# Patient Record
Sex: Male | Born: 1995 | Race: White | Hispanic: No | Marital: Single | Smoking: Former smoker
Health system: Southern US, Community
[De-identification: ages and names within clinical notes are randomized; demographics above are authoritative.]

---

## 2013-08-18 ENCOUNTER — Ambulatory Visit (INDEPENDENT_AMBULATORY_CARE_PROVIDER_SITE_OTHER): Payer: BC Managed Care – PPO | Admitting: Family Medicine

## 2013-08-18 ENCOUNTER — Encounter: Payer: Self-pay | Admitting: Family Medicine

## 2013-08-18 ENCOUNTER — Encounter (INDEPENDENT_AMBULATORY_CARE_PROVIDER_SITE_OTHER): Payer: Self-pay

## 2013-08-18 VITALS — BP 108/65 | HR 68 | Temp 97.7°F | Ht 71.0 in | Wt 174.0 lb

## 2013-08-18 DIAGNOSIS — B353 Tinea pedis: Secondary | ICD-10-CM

## 2013-08-18 NOTE — Progress Notes (Signed)
   Subjective:    Patient ID: Aaron Valencia, male    DOB: Jul 14, 1995, 18 y.o.   MRN: [id number]  HPI fett are cracked, pruritic.  Has tried several OTC meds without relief.  He thinks since OTC meds not helped it is not athlete's foot.    Review of Systems  Constitutional: Negative.   HENT: Negative.   Skin:       Bilateral feet are cracked and itching  All other systems reviewed and are negative.      Objective:   Physical Exam  Nursing note and vitals reviewed. Constitutional: He appears well-developed and well-nourished.  Skin: Skin is warm. Rash: Both feet have skin that is white and cracked; no sign of bacterial infection.          Assessment & Plan:  1. Tinea pedis of both feet Despite pt thinking otherwise, this is a tinea infection.  I think it has not responded to OTC because the feet stay wet.  I stressed the importance of changing cotton or wool socks several times a day, drying well, leaving open to the air when possible. He will try Lamisil cream with above measures.  If not better in 2 weeks, call for Lamisil tablets  Frederica KusterStephen M Maureen Delatte MD

## 2013-08-18 NOTE — Patient Instructions (Addendum)
Smoking Cessation Quitting smoking is important to your health and has many advantages. However, it is not always easy to quit since nicotine is a very addictive drug. Often times, people try 3 times or more before being able to quit. This document explains the best ways for you to prepare to quit smoking. Quitting takes hard work and a lot of effort, but you can do it. ADVANTAGES OF QUITTING SMOKING  You will live longer, feel better, and live better.  Your body will feel the impact of quitting smoking almost immediately.  Within 20 minutes, blood pressure decreases. Your pulse returns to its normal level.  After 8 hours, carbon monoxide levels in the blood return to normal. Your oxygen level increases.  After 24 hours, the chance of having a heart attack starts to decrease. Your breath, hair, and body stop smelling like smoke.  After 48 hours, damaged nerve endings begin to recover. Your sense of taste and smell improve.  After 72 hours, the body is virtually free of nicotine. Your bronchial tubes relax and breathing becomes easier.  After 2 to 12 weeks, lungs can hold more air. Exercise becomes easier and circulation improves.  The risk of having a heart attack, stroke, cancer, or lung disease is greatly reduced.  After 1 year, the risk of coronary heart disease is cut in half.  After 5 years, the risk of stroke falls to the same as a nonsmoker.  After 10 years, the risk of lung cancer is cut in half and the risk of other cancers decreases significantly.  After 15 years, the risk of coronary heart disease drops, usually to the level of a nonsmoker.  If you are pregnant, quitting smoking will improve your chances of having a healthy baby.  The people you live with, especially any children, will be healthier.  You will have extra money to spend on things other than cigarettes. QUESTIONS TO THINK ABOUT BEFORE ATTEMPTING TO QUIT You may want to talk about your answers with your  caregiver.  Why do you want to quit?  If you tried to quit in the past, what helped and what did not?  What will be the most difficult situations for you after you quit? How will you plan to handle them?  Who can help you through the tough times? Your family? Friends? A caregiver?  What pleasures do you get from smoking? What ways can you still get pleasure if you quit? Here are some questions to ask your caregiver:  How can you help me to be successful at quitting?  What medicine do you think would be best for me and how should I take it?  What should I do if I need more help?  What is smoking withdrawal like? How can I get information on withdrawal? GET READY  Set a quit date.  Change your environment by getting rid of all cigarettes, ashtrays, matches, and lighters in your home, car, or work. Do not let people smoke in your home.  Review your past attempts to quit. Think about what worked and what did not. GET SUPPORT AND ENCOURAGEMENT You have a better chance of being successful if you have help. You can get support in many ways.  Tell your family, friends, and co-workers that you are going to quit and need their support. Ask them not to smoke around you.  Get individual, group, or telephone counseling and support. Programs are available at local hospitals and health centers. Call your local health department for   information about programs in your area.  Spiritual beliefs and practices may help some smokers quit.  Download a "quit meter" on your computer to keep track of quit statistics, such as how long you have gone without smoking, cigarettes not smoked, and money saved.  Get a self-help book about quitting smoking and staying off of tobacco. LEARN NEW SKILLS AND BEHAVIORS  Distract yourself from urges to smoke. Talk to someone, go for a walk, or occupy your time with a task.  Change your normal routine. Take a different route to work. Drink tea instead of coffee.  Eat breakfast in a different place.  Reduce your stress. Take a hot bath, exercise, or read a book.  Plan something enjoyable to do every day. Reward yourself for not smoking.  Explore interactive web-based programs that specialize in helping you quit. GET MEDICINE AND USE IT CORRECTLY Medicines can help you stop smoking and decrease the urge to smoke. Combining medicine with the above behavioral methods and support can greatly increase your chances of successfully quitting smoking.  Nicotine replacement therapy helps deliver nicotine to your body without the negative effects and risks of smoking. Nicotine replacement therapy includes nicotine gum, lozenges, inhalers, nasal sprays, and skin patches. Some may be available over-the-counter and others require a prescription.  Antidepressant medicine helps people abstain from smoking, but how this works is unknown. This medicine is available by prescription.  Nicotinic receptor partial agonist medicine simulates the effect of nicotine in your brain. This medicine is available by prescription. Ask your caregiver for advice about which medicines to use and how to use them based on your health history. Your caregiver will tell you what side effects to look out for if you choose to be on a medicine or therapy. Carefully read the information on the package. Do not use any other product containing nicotine while using a nicotine replacement product.  RELAPSE OR DIFFICULT SITUATIONS Most relapses occur within the first 3 months after quitting. Do not be discouraged if you start smoking again. Remember, most people try several times before finally quitting. You may have symptoms of withdrawal because your body is used to nicotine. You may crave cigarettes, be irritable, feel very hungry, cough often, get headaches, or have difficulty concentrating. The withdrawal symptoms are only temporary. They are strongest when you first quit, but they will go away within  10-14 days. To reduce the chances of relapse, try to:  Avoid drinking alcohol. Drinking lowers your chances of successfully quitting.  Reduce the amount of caffeine you consume. Once you quit smoking, the amount of caffeine in your body increases and can give you symptoms, such as a rapid heartbeat, sweating, and anxiety.  Avoid smokers because they can make you want to smoke.  Do not let weight gain distract you. Many smokers will gain weight when they quit, usually less than 10 pounds. Eat a healthy diet and stay active. You can always lose the weight gained after you quit.  Find ways to improve your mood other than smoking. FOR MORE INFORMATION  www.smokefree.gov  Document Released: 01/20/2001 Document Revised: 07/28/2011 Document Reviewed: 05/07/2011 [facility] Patient Information 2015 [employer] [place]. This information is not intended to replace advice given to you by your health care provider. Make sure you discuss any questions you have with your health care provider. Athlete's Foot Athlete's foot (tinea pedis) is a fungal infection of the skin on the feet. It often occurs on the skin between the toes or underneath the toes. It  can also occur on the soles of the feet. Athlete's foot is more likely to occur in hot, humid weather. Not washing your feet or changing your socks often enough can contribute to athlete's foot. The infection can spread from person to person (contagious). CAUSES Athlete's foot is caused by a fungus. This fungus thrives in warm, moist places. Most people get athlete's foot by sharing shower stalls, towels, and wet floors with an infected person. People with weakened immune systems, including those with diabetes, may be more likely to get athlete's foot. SYMPTOMS   Itchy areas between the toes or on the soles of the feet.  White, flaky, or scaly areas between the toes or on the soles of the feet.  Tiny, intensely itchy blisters between the toes or on the soles of  the feet.  Tiny cuts on the skin. These cuts can develop a bacterial infection.  Thick or discolored toenails. DIAGNOSIS  Your caregiver can usually tell what the problem is by doing a physical exam. Your caregiver may also take a skin sample from the rash area. The skin sample may be examined under a microscope, or it may be tested to see if fungus will grow in the sample. A sample may also be taken from your toenail for testing. TREATMENT  Over-the-counter and prescription medicines can be used to kill the fungus. These medicines are available as powders or creams. Your caregiver can suggest medicines for you. Fungal infections respond slowly to treatment. You may need to continue using your medicine for several weeks. PREVENTION   Do not share towels.  Wear sandals in wet areas, such as shared locker rooms and shared showers.  Keep your feet dry. Wear shoes that allow air to circulate. Wear cotton or wool socks. HOME CARE INSTRUCTIONS   Take medicines as directed by your caregiver. Do not use steroid creams on athlete's foot.  Keep your feet clean and cool. Wash your feet daily and dry them thoroughly, especially between your toes.  Change your socks every day. Wear cotton or wool socks. In hot climates, you may need to change your socks 2 to 3 times per day.  Wear sandals or canvas tennis shoes with good air circulation.  If you have blisters, soak your feet in Burow's solution or Epsom salts for 20 to 30 minutes, 2 times a day to dry out the blisters. Make sure you dry your feet thoroughly afterward. SEEK MEDICAL CARE IF:   You have a fever.  You have swelling, soreness, warmth, or redness in your foot.  You are not getting better after 7 days of treatment.  You are not completely cured after 30 days.  You have any problems caused by your medicines. MAKE SURE YOU:   Understand these instructions.  Will watch your condition.  Will get help right away if you are not  doing well or get worse. Document Released: 01/24/2000 Document Revised: 04/20/2011 Document Reviewed: 11/14/2010 [facility] Patient Information 2015 [place], [place]. This information is not intended to replace advice given to you by your health care provider. Make sure you discuss any questions you have with your health care provider. Athlete's Foot Athlete's foot (tinea pedis) is a fungal infection of the skin on the feet. It often occurs on the skin between the toes or underneath the toes. It can also occur on the soles of the feet. Athlete's foot is more likely to occur in hot, humid weather. Not washing your feet or changing your socks often enough can  contribute to athlete's foot. The infection can spread from person to person (contagious). CAUSES Athlete's foot is caused by a fungus. This fungus thrives in warm, moist places. Most people get athlete's foot by sharing shower stalls, towels, and wet floors with an infected person. People with weakened immune systems, including those with diabetes, may be more likely to get athlete's foot. SYMPTOMS   Itchy areas between the toes or on the soles of the feet.  White, flaky, or scaly areas between the toes or on the soles of the feet.  Tiny, intensely itchy blisters between the toes or on the soles of the feet.  Tiny cuts on the skin. These cuts can develop a bacterial infection.  Thick or discolored toenails. DIAGNOSIS  Your caregiver can usually tell what the problem is by doing a physical exam. Your caregiver may also take a skin sample from the rash area. The skin sample may be examined under a microscope, or it may be tested to see if fungus will grow in the sample. A sample may also be taken from your toenail for testing. TREATMENT  Over-the-counter and prescription medicines can be used to kill the fungus. These medicines are available as powders or creams. Your caregiver can suggest medicines for you. Fungal infections respond slowly to  treatment. You may need to continue using your medicine for several weeks. PREVENTION   Do not share towels.  Wear sandals in wet areas, such as shared locker rooms and shared showers.  Keep your feet dry. Wear shoes that allow air to circulate. Wear cotton or wool socks. HOME CARE INSTRUCTIONS   Take medicines as directed by your caregiver. Do not use steroid creams on athlete's foot.  Keep your feet clean and cool. Wash your feet daily and dry them thoroughly, especially between your toes.  Change your socks every day. Wear cotton or wool socks. In hot climates, you may need to change your socks 2 to 3 times per day.  Wear sandals or canvas tennis shoes with good air circulation.  If you have blisters, soak your feet in Burow's solution or Epsom salts for 20 to 30 minutes, 2 times a day to dry out the blisters. Make sure you dry your feet thoroughly afterward. SEEK MEDICAL CARE IF:   You have a fever.  You have swelling, soreness, warmth, or redness in your foot.  You are not getting better after 7 days of treatment.  You are not completely cured after 30 days.  You have any problems caused by your medicines. MAKE SURE YOU:   Understand these instructions.  Will watch your condition.  Will get help right away if you are not doing well or get worse. Document Released: 01/24/2000 Document Revised: 04/20/2011 Document Reviewed: 11/14/2010 [facility] Patient Information 2015 [place], [place]. This information is not intended to replace advice given to you by your health care provider. Make sure you discuss any questions you have with your health care provider.

## 2014-08-06 ENCOUNTER — Ambulatory Visit (INDEPENDENT_AMBULATORY_CARE_PROVIDER_SITE_OTHER): Payer: Worker's Compensation | Admitting: Emergency Medicine

## 2014-08-06 VITALS — BP 116/80 | HR 78 | Temp 97.5°F | Resp 18 | Ht 67.0 in | Wt 162.0 lb

## 2014-08-06 DIAGNOSIS — S335XXA Sprain of ligaments of lumbar spine, initial encounter: Secondary | ICD-10-CM

## 2014-08-06 MED ORDER — HYDROCODONE-ACETAMINOPHEN 5-325 MG PO TABS: 1.0000 | ORAL_TABLET | ORAL | Status: DC | PRN

## 2014-08-06 MED ORDER — NAPROXEN SODIUM 550 MG PO TABS: 550.0000 mg | ORAL_TABLET | Freq: Two times a day (BID) | ORAL | Status: AC

## 2014-08-06 MED ORDER — CYCLOBENZAPRINE HCL 10 MG PO TABS: 10.0000 mg | ORAL_TABLET | Freq: Three times a day (TID) | ORAL | Status: AC | PRN

## 2014-08-06 NOTE — Progress Notes (Signed)
Aaron AgeeBrian Valencia February 14, 1995 19 y.o.   Chief Complaint  Patient presents with  . Back Injury    Between the middle and lower back. Happened today after lunch (around 12 or 1230). Lifted a jackhammer and twisted his back.      Date of Injury: 08/06/2014  History of Present Illness:  Presents for evaluation of work-related complaint. Stated that when he was at work today he tried to lift and 90 pound jackhammer out of a trailer and in doing so twisted his back and felt immediate low back pain. He said the pain caused her fall to ground. He had no radiation of pain numbness tingling  or weakness in his legs. He has no history of prior back injury. He's had no improvement with over-the-counter medication.  Review of Systems  Neurological: Negative for sensory change and focal weakness.  Psychiatric/Behavioral: Negative.     Review of Systems  Constitutional: Negative for fever, chills and fatigue.  HENT: Negative for congestion, ear pain, hearing loss, postnasal drip, rhinorrhea and sinus pressure.   Eyes: Negative for discharge and redness.  Respiratory: Negative for cough, shortness of breath and wheezing.   Cardiovascular: Negative for chest pain and leg swelling.  Gastrointestinal: Negative for nausea, vomiting, abdominal pain, constipation and blood in stool.  Genitourinary: Negative for dysuria, urgency and frequency.  Musculoskeletal: Negative for neck stiffness.  Skin: Negative for rash.  Neurological: Negative for seizures, weakness and headaches.     No Known Allergies   Current medications reviewed and updated. Past medical history, family history, social history have been reviewed and updated.   Physical Exam  Constitutional: He appears distressed.  HENT:  Head: Normocephalic and atraumatic.  Eyes: Pupils are equal, round, and reactive to light.  Neck: Normal range of motion. Neck supple.  Cardiovascular: Normal rate and regular rhythm.   Pulmonary/Chest: Effort  normal and breath sounds normal. No respiratory distress.  Abdominal: Soft. There is no tenderness.  Musculoskeletal: He exhibits tenderness.       Lumbar back: He exhibits decreased range of motion, tenderness, pain and spasm. He exhibits no swelling and no deformity.     Assessment and Plan:   Acute lumbosacral strain.  He was put on light duty at work. He was treated with Anaprox Flexeril and Vicodin. In one week for review.

## 2014-08-06 NOTE — Patient Instructions (Signed)

## 2015-10-12 ENCOUNTER — Encounter (HOSPITAL_BASED_OUTPATIENT_CLINIC_OR_DEPARTMENT_OTHER): Payer: Self-pay | Admitting: Emergency Medicine

## 2015-10-12 ENCOUNTER — Emergency Department (HOSPITAL_BASED_OUTPATIENT_CLINIC_OR_DEPARTMENT_OTHER): Payer: BLUE CROSS/BLUE SHIELD

## 2015-10-12 ENCOUNTER — Emergency Department (HOSPITAL_BASED_OUTPATIENT_CLINIC_OR_DEPARTMENT_OTHER)
Admission: EM | Admit: 2015-10-12 | Discharge: 2015-10-13 | Disposition: A | Discharge status: Home / Self Care | Payer: BLUE CROSS/BLUE SHIELD | Attending: Emergency Medicine | Admitting: Emergency Medicine

## 2015-10-12 DIAGNOSIS — Y999 Unspecified external cause status: Secondary | ICD-10-CM | POA: Insufficient documentation

## 2015-10-12 DIAGNOSIS — Z23 Encounter for immunization: Secondary | ICD-10-CM | POA: Insufficient documentation

## 2015-10-12 DIAGNOSIS — Z87891 Personal history of nicotine dependence: Secondary | ICD-10-CM | POA: Insufficient documentation

## 2015-10-12 DIAGNOSIS — Y939 Activity, unspecified: Secondary | ICD-10-CM | POA: Diagnosis not present

## 2015-10-12 DIAGNOSIS — S0230XA Fracture of orbital floor, unspecified side, initial encounter for closed fracture: Secondary | ICD-10-CM

## 2015-10-12 DIAGNOSIS — S0993XA Unspecified injury of face, initial encounter: Secondary | ICD-10-CM | POA: Diagnosis present

## 2015-10-12 DIAGNOSIS — S0232XA Fracture of orbital floor, left side, initial encounter for closed fracture: Secondary | ICD-10-CM | POA: Diagnosis not present

## 2015-10-12 DIAGNOSIS — I619 Nontraumatic intracerebral hemorrhage, unspecified: Secondary | ICD-10-CM | POA: Diagnosis not present

## 2015-10-12 DIAGNOSIS — Y92410 Unspecified street and highway as the place of occurrence of the external cause: Secondary | ICD-10-CM | POA: Diagnosis not present

## 2015-10-12 MED ORDER — SODIUM CHLORIDE 0.9 % IV BOLUS (SEPSIS)
1000.0000 mL | Freq: Once | INTRAVENOUS | Status: AC
  Administered 2015-10-13: 1000 mL via INTRAVENOUS

## 2015-10-12 MED ORDER — TETANUS-DIPHTH-ACELL PERTUSSIS 5-2.5-18.5 LF-MCG/0.5 IM SUSP
0.5000 mL | Freq: Once | INTRAMUSCULAR | Status: AC
  Administered 2015-10-13: 0.5 mL via INTRAMUSCULAR
  Filled 2015-10-12: qty 0.5
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
## 2015-10-13 ENCOUNTER — Emergency Department (HOSPITAL_BASED_OUTPATIENT_CLINIC_OR_DEPARTMENT_OTHER): Payer: BLUE CROSS/BLUE SHIELD

## 2015-10-13 DIAGNOSIS — S0232XA Fracture of orbital floor, left side, initial encounter for closed fracture: Secondary | ICD-10-CM | POA: Diagnosis not present

## 2015-10-13 LAB — BASIC METABOLIC PANEL
Anion gap: 10 (ref 5–15)
BUN: 12 mg/dL (ref 6–20)
CHLORIDE: 106 mmol/L (ref 101–111)
CO2: 25 mmol/L (ref 22–32)
CREATININE: 1.07 mg/dL (ref 0.61–1.24)
Calcium: 9 mg/dL (ref 8.9–10.3)
Glucose, Bld: 108 mg/dL — ABNORMAL HIGH (ref 65–99)
POTASSIUM: 3.6 mmol/L (ref 3.5–5.1)
SODIUM: 141 mmol/L (ref 135–145)

## 2015-10-13 LAB — CBC WITH DIFFERENTIAL/PLATELET
BASOS ABS: 0 10*3/uL (ref 0.0–0.1)
Basophils Relative: 0 %
EOS ABS: 0 10*3/uL (ref 0.0–0.7)
Eosinophils Relative: 0 %
HCT: 45.7 % (ref 39.0–52.0)
HEMOGLOBIN: 16 g/dL (ref 13.0–17.0)
LYMPHS PCT: 13 %
Lymphs Abs: 1.9 10*3/uL (ref 0.7–4.0)
MCH: 31.1 pg (ref 26.0–34.0)
MCHC: 35 g/dL (ref 30.0–36.0)
MCV: 88.7 fL (ref 78.0–100.0)
MONO ABS: 0.6 10*3/uL (ref 0.1–1.0)
Monocytes Relative: 4 %
NEUTROS PCT: 83 %
Neutro Abs: 12.4 10*3/uL — ABNORMAL HIGH (ref 1.7–7.7)
PLATELETS: 220 10*3/uL (ref 150–400)
RBC: 5.15 MIL/uL (ref 4.22–5.81)
RDW: 12.3 % (ref 11.5–15.5)
WBC: 14.9 10*3/uL — AB (ref 4.0–10.5)

## 2015-10-13 MED ORDER — HYDROCODONE-ACETAMINOPHEN 5-325 MG PO TABS: 1.0000 | ORAL_TABLET | Freq: Two times a day (BID) | ORAL | 0 refills | Status: AC | PRN

## 2015-10-13 MED ORDER — IOPAMIDOL (ISOVUE-300) INJECTION 61%
100.0000 mL | Freq: Once | INTRAVENOUS | Status: AC | PRN
  Administered 2015-10-13: 100 mL via INTRAVENOUS
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
